# Patient Record
Sex: Male | Born: 1975 | Race: White | Hispanic: No | Marital: Married | Smoking: Never smoker
Health system: Southern US, Community
[De-identification: ages and names within clinical notes are randomized; demographics above are authoritative.]

---

## 2001-03-07 ENCOUNTER — Other Ambulatory Visit: Admission: RE | Admit: 2001-03-07 | Discharge: 2001-03-07 | Payer: Self-pay

## 2007-04-12 ENCOUNTER — Emergency Department (HOSPITAL_COMMUNITY): Admission: EM | Admit: 2007-04-12 | Discharge: 2007-04-12 | Payer: Self-pay | Admitting: Emergency Medicine

## 2011-03-13 LAB — BASIC METABOLIC PANEL
BUN: 10
CO2: 27
Calcium: 9.1
Chloride: 106
Creatinine, Ser: 1.05
GFR calc Af Amer: >60
Glucose, Bld: 103 — ABNORMAL HIGH
Potassium: 3.4 — ABNORMAL LOW
Sodium: 139

## 2011-03-13 LAB — CBC
HCT: 42.4
Hemoglobin: 14.6
MCHC: 34.5
MCV: 91
RDW: 13.4
WBC: 8.6

## 2011-03-13 LAB — POCT CARDIAC MARKERS: Troponin i, poc: <0.05

## 2011-03-13 LAB — DIFFERENTIAL
Eosinophils Relative: 2
Neutrophils Relative %: 72

## 2013-02-19 ENCOUNTER — Encounter: Payer: Self-pay | Admitting: Family Medicine

## 2013-02-19 ENCOUNTER — Ambulatory Visit (INDEPENDENT_AMBULATORY_CARE_PROVIDER_SITE_OTHER): Payer: BC Managed Care – PPO | Admitting: Family Medicine

## 2013-02-19 VITALS — BP 118/72 | Temp 99.0°F | Ht 71.0 in | Wt 195.4 lb

## 2013-02-19 DIAGNOSIS — T7840XA Allergy, unspecified, initial encounter: Secondary | ICD-10-CM

## 2013-02-19 DIAGNOSIS — K219 Gastro-esophageal reflux disease without esophagitis: Secondary | ICD-10-CM

## 2013-02-19 DIAGNOSIS — Z9109 Other allergy status, other than to drugs and biological substances: Secondary | ICD-10-CM | POA: Insufficient documentation

## 2013-02-19 MED ORDER — TRIAMCINOLONE ACETONIDE 0.1 % EX CREA: TOPICAL_CREAM | Freq: Two times a day (BID) | CUTANEOUS | Status: DC

## 2013-02-19 NOTE — Progress Notes (Signed)
  Subjective:    Patient ID: Maurice Sanchez, male    DOB: 04-19-1976, 37 y.o.   MRN: [id number]  HPI Patient here today for a tick bite that happened in June below his umbilicus.   He has an itchy rash that comes back intermittently.  Sig hx of reflux No other concerns.  Tick tiny , left  Intermittent rash,  concernede tried steroid cream, calmed down with a rash.  Review of Systems No headache no fever no rash elsewhere no chest pain no joint pain no muscle pain no fatigue    Objective:   Physical Exam Alert no acute distress. HEENT normal. Lungs clear. Heart regular rate and rhythm. Contact dermatitis rash noted anterior abdomen at site of buccal belt. Small papule noted abdominal exam otherwise benign       Assessment & Plan:  Impression 1 tick bite benign highly doubt related to any other symptoms no treatment necessary discussed. #2 nickel allergy discussed contact dermatitis present on abdomen. #3 reflux stable plan triamcinolone cream twice a day to affected area. Symptomatic care discussed. Avoid nickel belt buckles WSL

## 2013-05-04 ENCOUNTER — Telehealth: Payer: Self-pay | Admitting: Family Medicine

## 2013-05-04 NOTE — Telephone Encounter (Signed)
Patients wife called back to check on message and I told her that we needed to make an appointment. She was not going to at first, but she then decided to make one just in case. He has an appointment on Wednesday.

## 2013-05-04 NOTE — Telephone Encounter (Signed)
Needs o v here first (and baseline EKG)

## 2013-05-04 NOTE — Telephone Encounter (Signed)
Patient has been having rapid heart beat at night and early in the morning and when he wakes up his arm will be numb. His wife says that he has previously discussed this with the doctor and wife and patient are wondering if they need a referral?

## 2013-05-06 ENCOUNTER — Ambulatory Visit (INDEPENDENT_AMBULATORY_CARE_PROVIDER_SITE_OTHER): Payer: BC Managed Care – PPO | Admitting: Family Medicine

## 2013-05-06 ENCOUNTER — Encounter: Payer: Self-pay | Admitting: Family Medicine

## 2013-05-06 VITALS — BP 126/84 | Ht 71.0 in | Wt 190.2 lb

## 2013-05-06 DIAGNOSIS — R002 Palpitations: Secondary | ICD-10-CM

## 2013-05-06 DIAGNOSIS — G5600 Carpal tunnel syndrome, unspecified upper limb: Secondary | ICD-10-CM

## 2013-05-06 DIAGNOSIS — R079 Chest pain, unspecified: Secondary | ICD-10-CM

## 2013-05-06 DIAGNOSIS — G5601 Carpal tunnel syndrome, right upper limb: Secondary | ICD-10-CM

## 2013-05-06 DIAGNOSIS — R000 Tachycardia, unspecified: Secondary | ICD-10-CM

## 2013-05-06 NOTE — Patient Instructions (Signed)
These rapid heart spells are not from any serious problem with the heart, for all the reasons I covered. If you still would like to see a cardiologist, we can set up.

## 2013-05-06 NOTE — Progress Notes (Signed)
   Subjective:    Patient ID: Maurice Sanchez, male    DOB: Sep 30, 1975, 37 y.o.   MRN: [id number]  HPI  Patient arrives with complaint of rapid heartbeat at night for about a month. Sometimes wakes up and feels like heart is racing.  For the past mo, wakes up heart is racing. HR 140 to 150 bpm, lasts five minutes, and then stops  Sometimes numbness in right or left arm or legs.  HR will be elevated and then fades, woke up at times with a disturbing dream.  Sometimes flares up  Sig caffeine person, ftrying to cut down, soft drinks thre or four dur day Neg fam hx of heart diff in young rel  Review of Systems No headache no abdominal pain no nausea no diaphoresis exercises regularly no exertional chest pain no loss of consciousness ROS otherwise negative    Objective:   Physical Exam Alert pleasant no apparent distress HEENT normal. Lungs clear. Heart rare rhythm. Chest wall no tenderness abdomen benign. Positive Phalen's sign on exam right hand more than left   EKG completely normal    Assessment & Plan:  Impression #1 increase heart rate spells nighttime only. Dissipates slowly but gradually. Often when dreaming. Feel this is simple sinus tachycardia discussed. #2 transient chest pain sharp in nature lasts just a few seconds. Chest wall rationale discussed with patient why this is not cardiac. #3 carpal tunnel syndrome mild right wrist discussed also occasional numbness both arms which likely is neuropathy and not cardiac discussed plan reassurance exercise encourage warning signs discussed he would like to see cardiologist call back and we will set up. WSL

## 2013-05-07 DIAGNOSIS — G5600 Carpal tunnel syndrome, unspecified upper limb: Secondary | ICD-10-CM | POA: Insufficient documentation

## 2013-06-25 ENCOUNTER — Encounter: Payer: Self-pay | Admitting: Family Medicine

## 2019-06-18 ENCOUNTER — Other Ambulatory Visit: Payer: Self-pay

## 2019-06-18 ENCOUNTER — Ambulatory Visit
Admission: EM | Admit: 2019-06-18 | Discharge: 2019-06-18 | Disposition: A | Discharge status: Home / Self Care | Payer: 59 | Attending: Emergency Medicine | Admitting: Emergency Medicine

## 2019-06-18 ENCOUNTER — Ambulatory Visit (INDEPENDENT_AMBULATORY_CARE_PROVIDER_SITE_OTHER): Payer: 59

## 2019-06-18 DIAGNOSIS — S6992XA Unspecified injury of left wrist, hand and finger(s), initial encounter: Secondary | ICD-10-CM | POA: Diagnosis not present
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
## 2019-06-18 NOTE — Discharge Instructions (Signed)
X-ray negative for fracture or dislocation Radiologist suggested MRI to assess for ligamentous injury Continue conservative management of rest, ice, and elevation Continue with thumb brace at home.  Wear it at night as well Use OTC ibuprofen and/or tylenol as needed for pain and inflammation Follow up with orthopedist in 1-2 weeks for recheck, if not improving may be a good idea to have additional imaging ordered Return or go to the ER if you have any new or worsening symptoms (fever, chills, chest pain, swelling, redness, worsening symptoms, etc...)
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
## 2019-07-06 ENCOUNTER — Encounter: Payer: Self-pay | Admitting: Family Medicine

## 2020-02-22 ENCOUNTER — Other Ambulatory Visit: Payer: Self-pay

## 2020-02-22 ENCOUNTER — Other Ambulatory Visit: Payer: 59

## 2020-02-22 DIAGNOSIS — Z20822 Contact with and (suspected) exposure to covid-19: Secondary | ICD-10-CM

## 2020-02-24 LAB — SPECIMEN STATUS REPORT

## 2020-02-24 LAB — SARS-COV-2, NAA 2 DAY TAT

## 2020-02-24 LAB — NOVEL CORONAVIRUS, NAA: SARS-CoV-2, NAA: NOT DETECTED

## 2020-03-01 ENCOUNTER — Other Ambulatory Visit: Payer: 59

## 2020-03-01 ENCOUNTER — Other Ambulatory Visit: Payer: Self-pay | Admitting: Critical Care Medicine

## 2020-03-01 DIAGNOSIS — Z20822 Contact with and (suspected) exposure to covid-19: Secondary | ICD-10-CM

## 2020-03-02 LAB — SARS-COV-2, NAA 2 DAY TAT

## 2020-03-02 LAB — NOVEL CORONAVIRUS, NAA: SARS-CoV-2, NAA: NOT DETECTED

## 2020-10-19 IMAGING — DX DG HAND COMPLETE 3+V*L*
3 series · 3 of 3 positions shown · non-contrast
Comparison: None.

CLINICAL DATA: Thumb injury

EXAM:
LEFT HAND - COMPLETE 3+ VIEW

[hand pa]
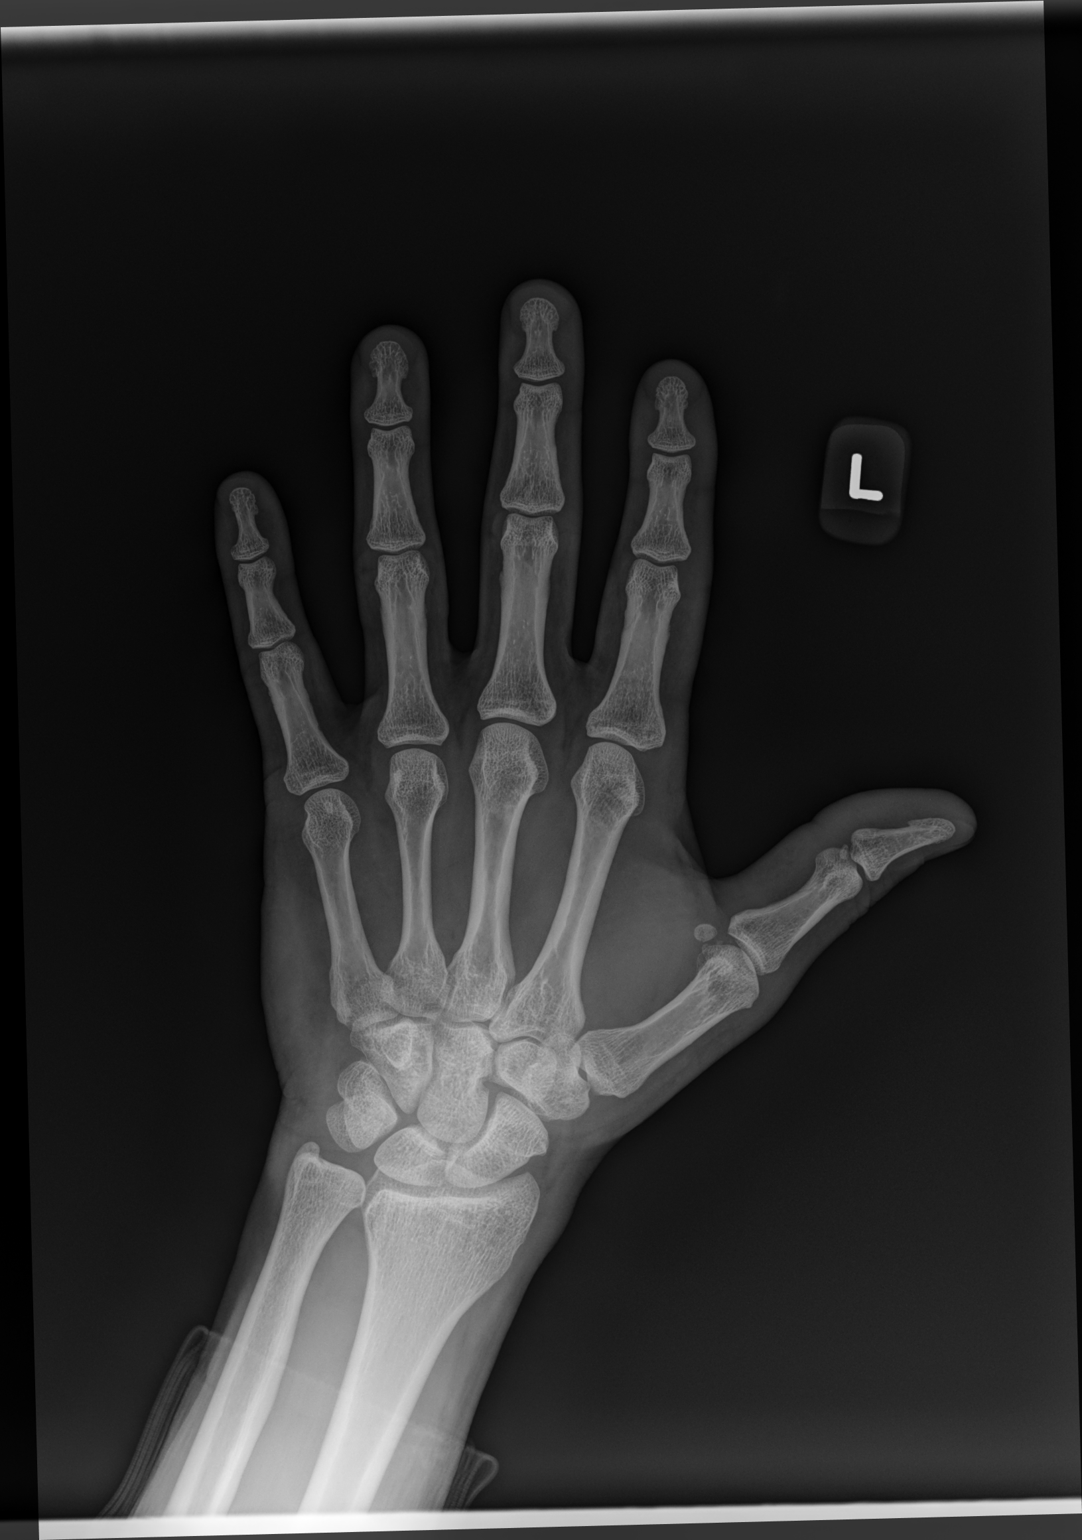

[hand mlo]
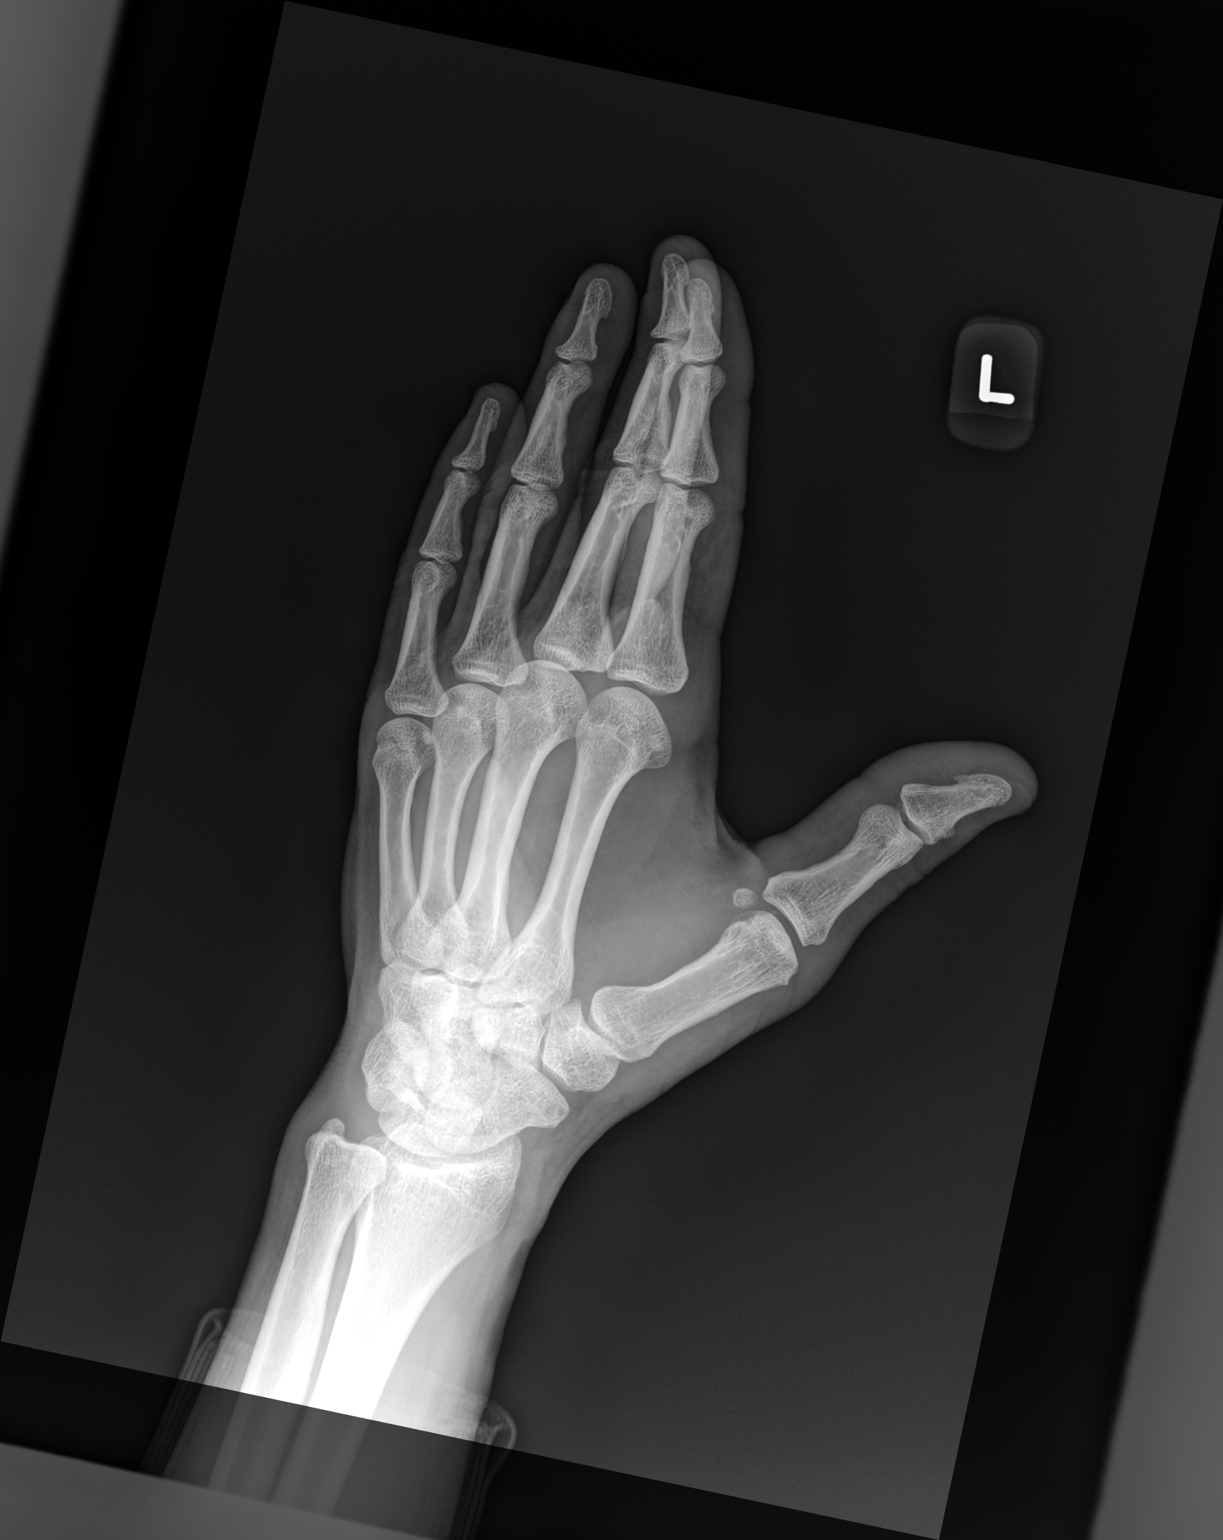

[hand lat]
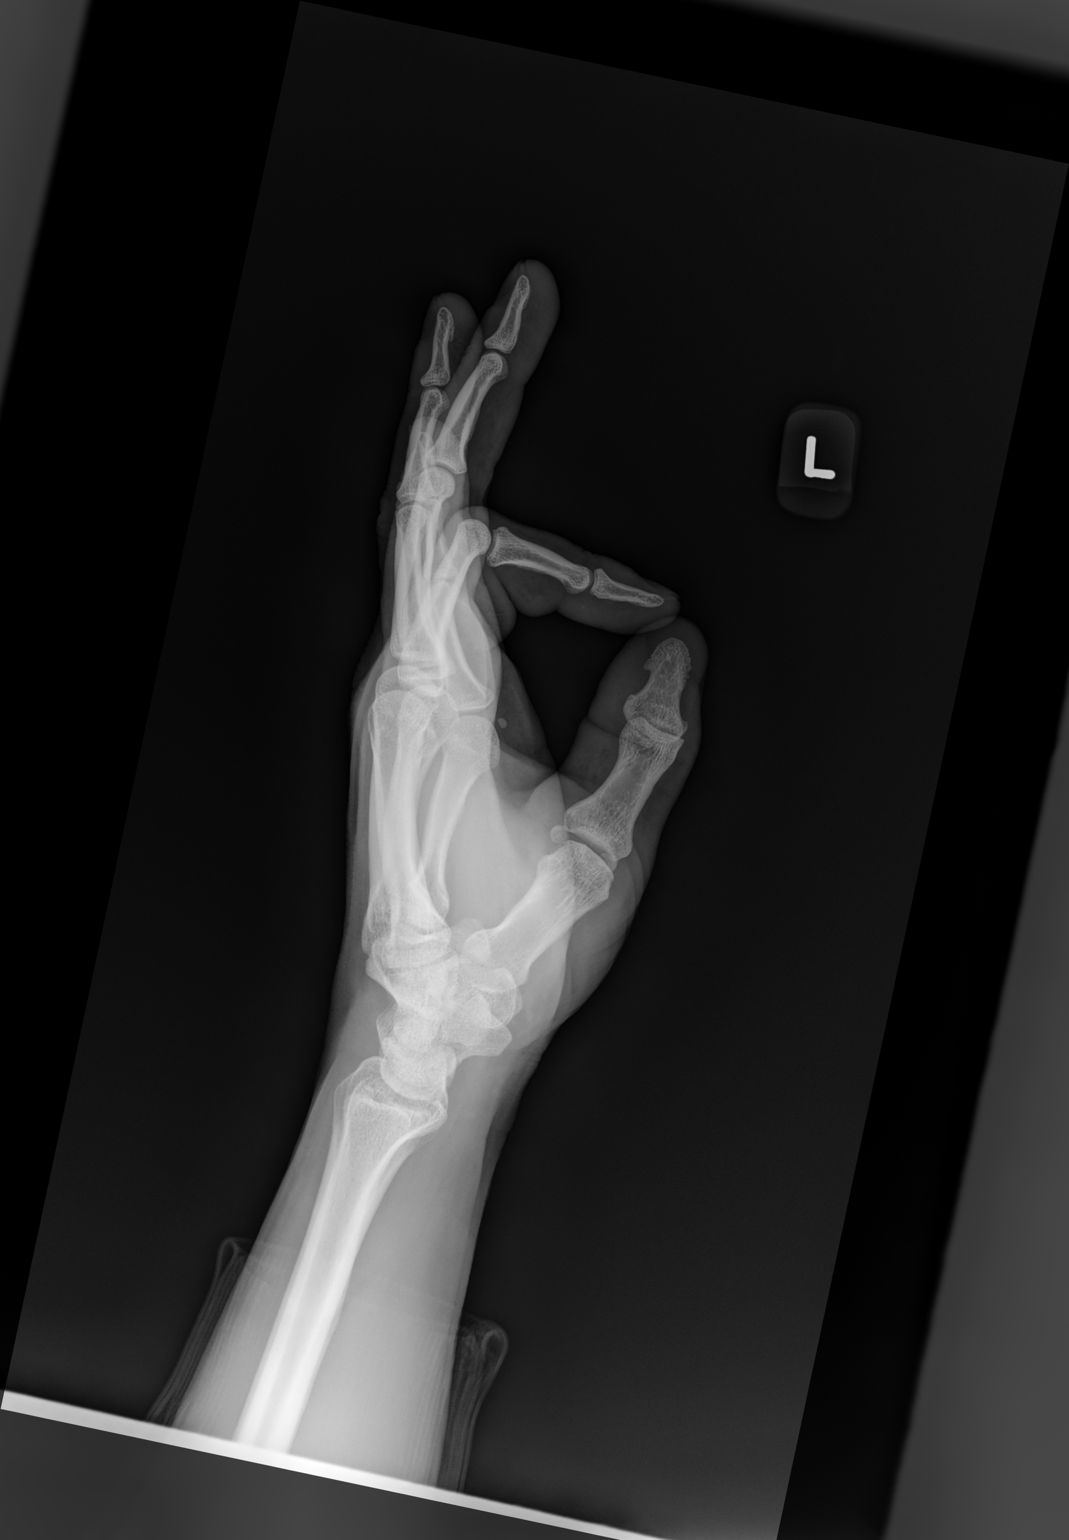

[3 of 3 positions shown; findings below may reference images not displayed]

FINDINGS: No fracture or dislocation of the left hand, with particular
attention to the thumb. Joint spaces are preserved. Mild soft tissue
edema about the thumb.
IMPRESSION: No fracture or dislocation of the left hand, with particular
attention to the thumb. Consider MRI to further assess for
ligamentous injury, if indicated by clinical examination and laxity.

## 2021-05-22 ENCOUNTER — Other Ambulatory Visit: Payer: Self-pay

## 2021-05-22 ENCOUNTER — Ambulatory Visit
Admission: EM | Admit: 2021-05-22 | Discharge: 2021-05-22 | Disposition: A | Discharge status: Home / Self Care | Payer: 59

## 2021-05-22 ENCOUNTER — Ambulatory Visit: Payer: Self-pay
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
## 2021-05-22 NOTE — ED Notes (Signed)
Patient is being discharged from the Urgent Care and sent to the Emergency Department via POV . Per [place] PA, patient is in need of higher level of care due to possible open fracture. Patient is aware and verbalizes understanding of plan of care.  Vitals:   05/22/21 1907  BP: 130/85  Pulse: 85  Resp: 16  Temp: 98.1 F (36.7 C)  SpO2: 96%
